# Patient Record
Sex: Male | Born: 1985 | Race: Black or African American | Hispanic: No | Marital: Married | Smoking: Never smoker
Health system: Southern US, Community
[De-identification: ages and names within clinical notes are randomized; demographics above are authoritative.]

## PROBLEM LIST (undated history)

## (undated) DIAGNOSIS — M1990 Unspecified osteoarthritis, unspecified site: Secondary | ICD-10-CM

## (undated) HISTORY — PX: HAND SURGERY: SHX662

---

## 2013-02-24 ENCOUNTER — Ambulatory Visit
Admission: RE | Admit: 2013-02-24 | Discharge: 2013-02-24 | Disposition: A | Discharge status: Home / Self Care | Payer: Worker's Compensation | Source: Ambulatory Visit | Attending: Family Medicine | Admitting: Family Medicine

## 2013-02-24 ENCOUNTER — Other Ambulatory Visit: Payer: Self-pay | Admitting: Family Medicine

## 2013-02-24 DIAGNOSIS — S39012A Strain of muscle, fascia and tendon of lower back, initial encounter: Secondary | ICD-10-CM

## 2018-07-14 ENCOUNTER — Other Ambulatory Visit: Payer: Self-pay | Admitting: Orthopedic Surgery

## 2018-07-14 DIAGNOSIS — L905 Scar conditions and fibrosis of skin: Secondary | ICD-10-CM

## 2018-07-14 DIAGNOSIS — L84 Corns and callosities: Secondary | ICD-10-CM

## 2018-07-25 ENCOUNTER — Ambulatory Visit
Admission: RE | Admit: 2018-07-25 | Discharge: 2018-07-25 | Disposition: A | Discharge status: Home / Self Care | Payer: Self-pay | Source: Ambulatory Visit | Attending: Orthopedic Surgery | Admitting: Orthopedic Surgery

## 2018-07-25 DIAGNOSIS — L84 Corns and callosities: Secondary | ICD-10-CM

## 2018-07-25 DIAGNOSIS — L905 Scar conditions and fibrosis of skin: Secondary | ICD-10-CM

## 2018-08-08 ENCOUNTER — Encounter (HOSPITAL_BASED_OUTPATIENT_CLINIC_OR_DEPARTMENT_OTHER): Payer: Self-pay | Admitting: *Deleted

## 2018-08-08 ENCOUNTER — Other Ambulatory Visit: Payer: Self-pay

## 2018-08-08 ENCOUNTER — Other Ambulatory Visit: Payer: Self-pay | Admitting: Orthopedic Surgery

## 2018-08-15 ENCOUNTER — Other Ambulatory Visit: Payer: Self-pay

## 2018-08-15 ENCOUNTER — Encounter (HOSPITAL_BASED_OUTPATIENT_CLINIC_OR_DEPARTMENT_OTHER)
Admission: RE | Disposition: A | Discharge status: Home / Self Care | Payer: Self-pay | Source: Home / Self Care | Attending: Orthopedic Surgery

## 2018-08-15 ENCOUNTER — Ambulatory Visit (HOSPITAL_BASED_OUTPATIENT_CLINIC_OR_DEPARTMENT_OTHER): Payer: BLUE CROSS/BLUE SHIELD | Admitting: Anesthesiology

## 2018-08-15 ENCOUNTER — Encounter (HOSPITAL_BASED_OUTPATIENT_CLINIC_OR_DEPARTMENT_OTHER): Payer: Self-pay | Admitting: *Deleted

## 2018-08-15 ENCOUNTER — Ambulatory Visit (HOSPITAL_BASED_OUTPATIENT_CLINIC_OR_DEPARTMENT_OTHER)
Admission: RE | Admit: 2018-08-15 | Discharge: 2018-08-15 | Disposition: A | Discharge status: Home / Self Care | Payer: BLUE CROSS/BLUE SHIELD | Attending: Orthopedic Surgery | Admitting: Orthopedic Surgery

## 2018-08-15 DIAGNOSIS — Z880 Allergy status to penicillin: Secondary | ICD-10-CM | POA: Insufficient documentation

## 2018-08-15 DIAGNOSIS — M720 Palmar fascial fibromatosis [Dupuytren]: Secondary | ICD-10-CM | POA: Insufficient documentation

## 2018-08-15 DIAGNOSIS — L84 Corns and callosities: Secondary | ICD-10-CM | POA: Diagnosis not present

## 2018-08-15 DIAGNOSIS — R2231 Localized swelling, mass and lump, right upper limb: Secondary | ICD-10-CM | POA: Diagnosis present

## 2018-08-15 HISTORY — DX: Unspecified osteoarthritis, unspecified site: M19.90

## 2018-08-15 HISTORY — PX: LESION REMOVAL: SHX5196

## 2018-08-15 SURGERY — WIDE EXCISION, LESION, UPPER EXTREMITY
Anesthesia: Monitor Anesthesia Care | Site: Hand | Laterality: Right

## 2018-08-15 MED ORDER — SCOPOLAMINE 1 MG/3DAYS TD PT72: 1.0000 | MEDICATED_PATCH | Freq: Once | TRANSDERMAL | Status: DC | PRN

## 2018-08-15 MED ORDER — CLINDAMYCIN PHOSPHATE 900 MG/50ML IV SOLN
INTRAVENOUS | Status: AC
  Filled 2018-08-15: qty 50

## 2018-08-15 MED ORDER — CLINDAMYCIN PHOSPHATE 900 MG/50ML IV SOLN
900.0000 mg | INTRAVENOUS | Status: AC
  Administered 2018-08-15: 900 mg via INTRAVENOUS

## 2018-08-15 MED ORDER — CHLORHEXIDINE GLUCONATE 4 % EX LIQD: 60.0000 mL | Freq: Once | CUTANEOUS | Status: DC

## 2018-08-15 MED ORDER — BUPIVACAINE HCL (PF) 0.25 % IJ SOLN
INTRAMUSCULAR | Status: DC | PRN
  Administered 2018-08-15: 6 mL

## 2018-08-15 MED ORDER — MIDAZOLAM HCL 2 MG/2ML IJ SOLN
1.0000 mg | INTRAMUSCULAR | Status: DC | PRN
  Administered 2018-08-15: 1 mg via INTRAVENOUS

## 2018-08-15 MED ORDER — MIDAZOLAM HCL 2 MG/2ML IJ SOLN
INTRAMUSCULAR | Status: AC
  Filled 2018-08-15: qty 2

## 2018-08-15 MED ORDER — TRAMADOL HCL 50 MG PO TABS: 50.0000 mg | ORAL_TABLET | Freq: Four times a day (QID) | ORAL | 0 refills | Status: AC | PRN

## 2018-08-15 MED ORDER — PROMETHAZINE HCL 25 MG/ML IJ SOLN: 6.2500 mg | INTRAMUSCULAR | Status: DC | PRN

## 2018-08-15 MED ORDER — ONDANSETRON HCL 4 MG/2ML IJ SOLN
INTRAMUSCULAR | Status: DC | PRN
  Administered 2018-08-15: 4 mg via INTRAVENOUS

## 2018-08-15 MED ORDER — LIDOCAINE HCL (CARDIAC) PF 100 MG/5ML IV SOSY
PREFILLED_SYRINGE | INTRAVENOUS | Status: DC | PRN
  Administered 2018-08-15: 50 mg via INTRAVENOUS

## 2018-08-15 MED ORDER — LACTATED RINGERS IV SOLN
INTRAVENOUS | Status: DC
  Administered 2018-08-15: 12:00:00 via INTRAVENOUS

## 2018-08-15 MED ORDER — FENTANYL CITRATE (PF) 100 MCG/2ML IJ SOLN: 25.0000 ug | INTRAMUSCULAR | Status: DC | PRN

## 2018-08-15 MED ORDER — PROPOFOL 500 MG/50ML IV EMUL
INTRAVENOUS | Status: DC | PRN
  Administered 2018-08-15: 75 ug/kg/min via INTRAVENOUS

## 2018-08-15 MED ORDER — FENTANYL CITRATE (PF) 100 MCG/2ML IJ SOLN
INTRAMUSCULAR | Status: AC
  Filled 2018-08-15: qty 2

## 2018-08-15 MED ORDER — LIDOCAINE HCL (PF) 0.5 % IJ SOLN
INTRAMUSCULAR | Status: DC | PRN
  Administered 2018-08-15: 35 mL via INTRAVENOUS

## 2018-08-15 MED ORDER — FENTANYL CITRATE (PF) 100 MCG/2ML IJ SOLN
50.0000 ug | INTRAMUSCULAR | Status: DC | PRN
  Administered 2018-08-15: 50 ug via INTRAVENOUS

## 2018-08-15 SURGICAL SUPPLY — 42 items
BANDAGE COBAN STERILE 2 (GAUZE/BANDAGES/DRESSINGS) IMPLANT
BLADE SURG 15 STRL LF DISP TIS (BLADE) ×1 IMPLANT
BLADE SURG 15 STRL SS (BLADE) ×2
BNDG COHESIVE 1X5 TAN STRL LF (GAUZE/BANDAGES/DRESSINGS) IMPLANT
BNDG COHESIVE 3X5 TAN STRL LF (GAUZE/BANDAGES/DRESSINGS) IMPLANT
BNDG ESMARK 4X9 LF (GAUZE/BANDAGES/DRESSINGS) IMPLANT
BNDG GAUZE ELAST 4 BULKY (GAUZE/BANDAGES/DRESSINGS) IMPLANT
CHLORAPREP W/TINT 26ML (MISCELLANEOUS) ×3 IMPLANT
CORD BIPOLAR FORCEPS 12FT (ELECTRODE) ×3 IMPLANT
COVER BACK TABLE 60X90IN (DRAPES) ×3 IMPLANT
COVER MAYO STAND STRL (DRAPES) ×3 IMPLANT
COVER WAND RF STERILE (DRAPES) IMPLANT
CUFF TOURNIQUET SINGLE 18IN (TOURNIQUET CUFF) IMPLANT
DECANTER SPIKE VIAL GLASS SM (MISCELLANEOUS) IMPLANT
DRAIN PENROSE 1/2X12 LTX STRL (WOUND CARE) IMPLANT
DRAPE EXTREMITY T 121X128X90 (DISPOSABLE) ×3 IMPLANT
DRAPE SURG 17X23 STRL (DRAPES) ×3 IMPLANT
GAUZE SPONGE 4X4 12PLY STRL (GAUZE/BANDAGES/DRESSINGS) ×3 IMPLANT
GAUZE XEROFORM 1X8 LF (GAUZE/BANDAGES/DRESSINGS) ×3 IMPLANT
GLOVE BIOGEL PI IND STRL 8.5 (GLOVE) ×1 IMPLANT
GLOVE BIOGEL PI INDICATOR 8.5 (GLOVE) ×2
GLOVE SURG ORTHO 8.0 STRL STRW (GLOVE) ×3 IMPLANT
GLOVE SURG SYN 8.0 (GLOVE) ×3 IMPLANT
GOWN STRL REUS W/ TWL LRG LVL3 (GOWN DISPOSABLE) ×1 IMPLANT
GOWN STRL REUS W/TWL LRG LVL3 (GOWN DISPOSABLE) ×2
GOWN STRL REUS W/TWL XL LVL3 (GOWN DISPOSABLE) ×3 IMPLANT
NDL SAFETY ECLIPSE 18X1.5 (NEEDLE) IMPLANT
NEEDLE HYPO 18GX1.5 SHARP (NEEDLE)
NEEDLE PRECISIONGLIDE 27X1.5 (NEEDLE) ×3 IMPLANT
NS IRRIG 1000ML POUR BTL (IV SOLUTION) ×3 IMPLANT
PACK BASIN DAY SURGERY FS (CUSTOM PROCEDURE TRAY) ×3 IMPLANT
PAD CAST 3X4 CTTN HI CHSV (CAST SUPPLIES) IMPLANT
PADDING CAST COTTON 3X4 STRL (CAST SUPPLIES)
SPLINT PLASTER CAST XFAST 3X15 (CAST SUPPLIES) IMPLANT
SPLINT PLASTER XTRA FASTSET 3X (CAST SUPPLIES)
STOCKINETTE 4X48 STRL (DRAPES) ×3 IMPLANT
SUT ETHILON 4 0 PS 2 18 (SUTURE) ×3 IMPLANT
SUT VIC AB 4-0 P2 18 (SUTURE) IMPLANT
SYR BULB 3OZ (MISCELLANEOUS) ×3 IMPLANT
SYR CONTROL 10ML LL (SYRINGE) ×3 IMPLANT
TOWEL GREEN STERILE FF (TOWEL DISPOSABLE) ×3 IMPLANT
UNDERPAD 30X30 (UNDERPADS AND DIAPERS) ×3 IMPLANT

## 2018-08-15 NOTE — Anesthesia Preprocedure Evaluation (Signed)
Anesthesia Evaluation  Patient identified by MRN, date of birth, ID band Patient awake    Reviewed: Allergy & Precautions, NPO status , Patient's Chart, lab work & pertinent test results  History of Anesthesia Complications Negative for: history of anesthetic complications  Airway Mallampati: II  TM Distance: >3 FB Neck ROM: Full    Dental  (+) Teeth Intact, Dental Advisory Given   Pulmonary neg pulmonary ROS,    Pulmonary exam normal breath sounds clear to auscultation       Cardiovascular negative cardio ROS Normal cardiovascular exam Rhythm:Regular Rate:Normal     Neuro/Psych negative neurological ROS  negative psych ROS   GI/Hepatic negative GI ROS, Neg liver ROS,   Endo/Other  negative endocrine ROS  Renal/GU negative Renal ROS     Musculoskeletal  (+) Arthritis ,   Abdominal   Peds  Hematology negative hematology ROS (+)   Anesthesia Other Findings Day of surgery medications reviewed with the patient.  Reproductive/Obstetrics                             Anesthesia Physical Anesthesia Plan  ASA: II  Anesthesia Plan: MAC and Bier Block and Bier Block-LIDOCAINE ONLY   Post-op Pain Management:    Induction: Intravenous  PONV Risk Score and Plan: 1 and Propofol infusion and Treatment may vary due to age or medical condition  Airway Management Planned: Nasal Cannula and Natural Airway  Additional Equipment:   Intra-op Plan:   Post-operative Plan:   Informed Consent: I have reviewed the patients History and Physical, chart, labs and discussed the procedure including the risks, benefits and alternatives for the proposed anesthesia with the patient or authorized representative who has indicated his/her understanding and acceptance.     Dental advisory given  Plan Discussed with: CRNA  Anesthesia Plan Comments:         Anesthesia Quick Evaluation

## 2018-08-15 NOTE — Anesthesia Postprocedure Evaluation (Signed)
Anesthesia Post Note  Patient: George Diaz  Procedure(s) Performed: LESION REMOVAL RIGHT MIDDLE FINGER (Right Hand)     Patient location during evaluation: PACU Anesthesia Type: MAC and Bier Block Level of consciousness: awake and alert Pain management: pain level controlled Vital Signs Assessment: post-procedure vital signs reviewed and stable Respiratory status: spontaneous breathing, nonlabored ventilation and respiratory function stable Cardiovascular status: stable and blood pressure returned to baseline Postop Assessment: no apparent nausea or vomiting Anesthetic complications: no    Last Vitals:  Vitals:   08/15/18 1425 08/15/18 1430  BP:    Pulse: 64 65  Resp:  20  Temp:    SpO2: 100% 98%    Last Pain:  Vitals:   08/15/18 1430  TempSrc:   PainSc: 0-No pain                 Catalina Gravel

## 2018-08-15 NOTE — H&P (Signed)
  George Diaz is an 33 y.o. male.   Chief Complaint: mass right middle finger HPI: George Diaz is a 33 year old right-hand-dominant male referred from dermatology for consultation regarding a area of opening on the dorsal aspect of his right middle finger. He states when he was 19 he stuck with a piece of metal while at work. He did not seek medical treatment at the time. It has healed over and formed a callus which became moderately swollen over the past week. He was seen at urgent care where was opened. He was placed on Bactrim. He states that the swelling has significantly diminished his pain is significantly diminished he was referred to dermatology who has immediately referred him. No cultures were taken. He has been on Bactrim. He states that the area of concern has not changed. He has moderate separation of his epidermis from the underlying dermis. Has not been debrided.He was referred for ultrasound to see if there was an foreign material still present was in the recurrent callus infection is for him. His ultrasound reveals a soft tissue poorly defined mass measuring 21 x 15 x 4 mm separated from the extensor tendon. There is fluid collection and debris at the base of it.     Past Medical History:  Diagnosis Date  . Arthritis     Past Surgical History:  Procedure Laterality Date  . HAND SURGERY      History reviewed. No pertinent family history. Social History:  reports that he has never smoked. He has never used smokeless tobacco. He reports current alcohol use. He reports that he does not use drugs.  Allergies:  Allergies  Allergen Reactions  . Amoxicillin Other (See Comments)    Headache   ? Angioedema listed in Care Everywhere    No medications prior to admission.    No results found for this or any previous visit (from the past 48 hour(s)).  No results found.   Pertinent items are noted in HPI.  Height 6' (1.829 m), weight 106.6 kg.  General appearance:  alert, cooperative and appears stated age Head: Normocephalic, without obvious abnormality Neck: no JVD Resp: clear to auscultation bilaterally Cardio: regular rate and rhythm, S1, S2 normal, no murmur, click, rub or gallop GI: soft, non-tender; bowel sounds normal; no masses,  no organomegaly Extremities:  mass right middle finger Pulses: 2+ and symmetric Skin: Skin color, texture, turgor normal. No rashes or lesions Neurologic: Grossly normal Incision/Wound: na  Assessment/Plan Assessment:  1. Scar  2. Callus  3. Foreign body (FB) in soft tissue    Plan: We have discussed excision of this along with the overlying skin is skin is supple enough to allow elliptical incision followed by direct primary closure. Pre-peri-and postoperative course are discussed along with him. He is aware there is no guarantee to the surgery possibility of infection recurrence injury to arteries nerves tendons complete relief. He is scheduled for excision mass right middle finger as an outpatient under regional anesthesia.   Cindee Salt 08/15/2018, 4:52 AM

## 2018-08-15 NOTE — Op Note (Signed)
NAME: George Diaz MEDICAL RECORD NO: [id number] DATE OF BIRTH: 1986/02/09 FACILITY: [facility] LOCATION: [facility] PHYSICIAN: Nicki Reaper, MD   OPERATIVE REPORT   DATE OF PROCEDURE: 08/15/18    PREOPERATIVE DIAGNOSIS:   Mass right middle finger   POSTOPERATIVE DIAGNOSIS: Same   PROCEDURE:   Excision mass right middle finger   SURGEON: Cindee Salt, M.D.   ASSISTANT: none   ANESTHESIA:  Bier block and Local   INTRAVENOUS FLUIDS:  Per anesthesia flow sheet.   ESTIMATED BLOOD LOSS:  Minimal.   COMPLICATIONS:  None.   SPECIMENS:   Mass right middle finger   TOURNIQUET TIME:    Total Tourniquet Time Documented: Forearm (Right) - 21 minutes Total: Forearm (Right) - 21 minutes    DISPOSITION:  Stable to PACU.   INDICATIONS: Patient is a 33 year old male with a long history of a mass over the dorsal aspect left middle finger felt to be a infected callus.  Ultrasound reveals a foreign body granuloma with involvement of the skin.  He has elected to undergo surgical excision of the mass along with the overlying skin.  Pre-peri-and postoperative course been discussed along with risks and complications.  He is aware that there is no guarantee to the surgery possibility of infection recurrence injury to arteries nerves tendons complete relief symptoms distally.  In preoperative patient seen extremity marked by both patient and surgeon antibiotic was given  OPERATIVE COURSE: She is brought to the operating room where form based IV regional anesthetic was carried out without difficulty.  Was prepped using ChloraPrep in the supine position with the arm free.  A three-minute dry time was allowed timeout taken to confirm patient procedure.  Elliptical incision was made over the mass carried down through subcutaneous tissue.  And was then elevated peripherally.  The entire area of skin mass beneath it was excised down to the extensor tendon.  Vascular structures were  identified protected as much as possible.  The specimen was sent to pathology measured 3-1/2 x 1-1/2 cm.  The wound was copiously irrigated with saline.  The skin was then closed with horizontal mattress vertical mattress sutures with 4-0 nylon sutures allowed complete closure.  Local infiltration quarter percent bupivacaine without epinephrine was given approximately 8 cc was used.  Sterile compressive dressing volar splint was applied to the middle ring and small finger.  Inflation of the tourniquet all fingers immediately pink.  Was taken to the recovery room for observation in satisfactory condition.  He would be discharged home to return [facility] in 1 week on Tylenol ibuprofen for pain with Ultram as a backup.   Cindee Salt, MD Electronically signed, 08/15/18

## 2018-08-15 NOTE — Transfer of Care (Signed)
Immediate Anesthesia Transfer of Care Note  Patient: George Diaz  Procedure(s) Performed: LESION REMOVAL RIGHT MIDDLE FINGER (Right Hand)  Patient Location: PACU  Anesthesia Type:Bier block  Level of Consciousness: awake, alert  and patient cooperative  Airway & Oxygen Therapy: Patient Spontanous Breathing  Post-op Assessment: Report given to RN and Post -op Vital signs reviewed and stable  Post vital signs: Reviewed and stable  Last Vitals:  Vitals Value Taken Time  BP    Temp    Pulse    Resp    SpO2      Last Pain:  Vitals:   08/15/18 1207  TempSrc: Oral  PainSc: 0-No pain      Patients Stated Pain Goal: 0 (08/15/18 1207)  Complications: No apparent anesthesia complications

## 2018-08-15 NOTE — Discharge Instructions (Addendum)

## 2018-08-15 NOTE — Brief Op Note (Signed)
08/15/2018  2:05 PM  PATIENT:  George Diaz  33 y.o. male  PRE-OPERATIVE DIAGNOSIS:  MASS RIGHT MIDDLE FINGER  POST-OPERATIVE DIAGNOSIS:  MASS RIGHT MIDDLE FINGER  PROCEDURE:  Procedure(s): LESION REMOVAL RIGHT MIDDLE FINGER (Right)  SURGEON:  Surgeon(s) and Role:    Cindee Salt, MD - Primary  PHYSICIAN ASSISTANT:   ASSISTANTS: none   ANESTHESIA:   local, regional and IV sedation  EBL: 33ml  BLOOD ADMINISTERED:none  DRAINS: none   LOCAL MEDICATIONS USED:  BUPIVICAINE   SPECIMEN:  Excision  DISPOSITION OF SPECIMEN:  PATHOLOGY  COUNTS:  YES  TOURNIQUET:   Total Tourniquet Time Documented: Forearm (Right) - 21 minutes Total: Forearm (Right) - 21 minutes   DICTATION: .Reubin Milan Dictation  PLAN OF CARE: Discharge to home after PACU  PATIENT DISPOSITION:  PACU - hemodynamically stable.

## 2018-08-18 ENCOUNTER — Encounter (HOSPITAL_BASED_OUTPATIENT_CLINIC_OR_DEPARTMENT_OTHER): Payer: Self-pay | Admitting: Orthopedic Surgery

## 2019-09-16 ENCOUNTER — Other Ambulatory Visit: Payer: Self-pay | Admitting: Family Medicine

## 2019-09-16 DIAGNOSIS — M5416 Radiculopathy, lumbar region: Secondary | ICD-10-CM

## 2019-09-16 DIAGNOSIS — M545 Low back pain, unspecified: Secondary | ICD-10-CM

## 2019-09-16 DIAGNOSIS — R29898 Other symptoms and signs involving the musculoskeletal system: Secondary | ICD-10-CM

## 2019-09-26 ENCOUNTER — Other Ambulatory Visit: Payer: Self-pay

## 2019-09-26 ENCOUNTER — Ambulatory Visit
Admission: RE | Admit: 2019-09-26 | Discharge: 2019-09-26 | Disposition: A | Discharge status: Home / Self Care | Payer: BLUE CROSS/BLUE SHIELD | Source: Ambulatory Visit | Attending: Family Medicine | Admitting: Family Medicine

## 2019-09-26 DIAGNOSIS — M545 Low back pain, unspecified: Secondary | ICD-10-CM

## 2019-09-26 DIAGNOSIS — M5416 Radiculopathy, lumbar region: Secondary | ICD-10-CM

## 2019-09-26 DIAGNOSIS — R29898 Other symptoms and signs involving the musculoskeletal system: Secondary | ICD-10-CM

## 2020-03-23 ENCOUNTER — Other Ambulatory Visit: Payer: Self-pay

## 2020-03-23 ENCOUNTER — Other Ambulatory Visit: Payer: BC Managed Care – PPO

## 2020-03-23 DIAGNOSIS — Z20822 Contact with and (suspected) exposure to covid-19: Secondary | ICD-10-CM

## 2020-03-25 LAB — NOVEL CORONAVIRUS, NAA: SARS-CoV-2, NAA: NOT DETECTED

## 2021-09-20 IMAGING — MR MR LUMBAR SPINE W/O CM
5 series · 46 of 48 positions shown · non-contrast
Comparison: Radiography 02/24/2013

CLINICAL DATA: Low back pain radiating to the right buttock over
the last 2 months.

EXAM:
MRI LUMBAR SPINE WITHOUT CONTRAST
TECHNIQUE: Multiplanar, multisequence MR imaging of the lumbar spine was
performed. No intravenous contrast was administered.

[Series 3: tirm sag · sagittal · 4.0mm · 0.55mm/px · 6 of 13 slices shown]
[im 1/13]
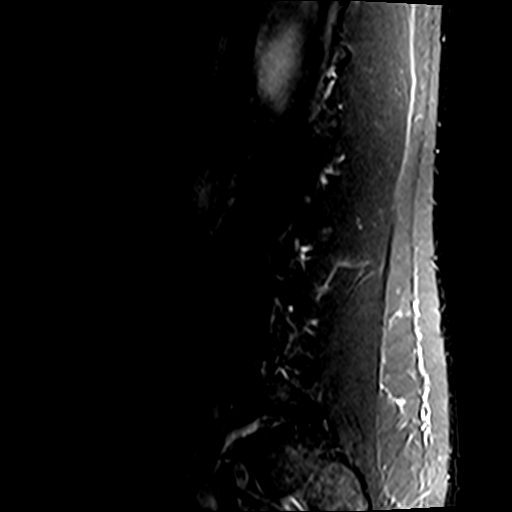
[im 3/13]
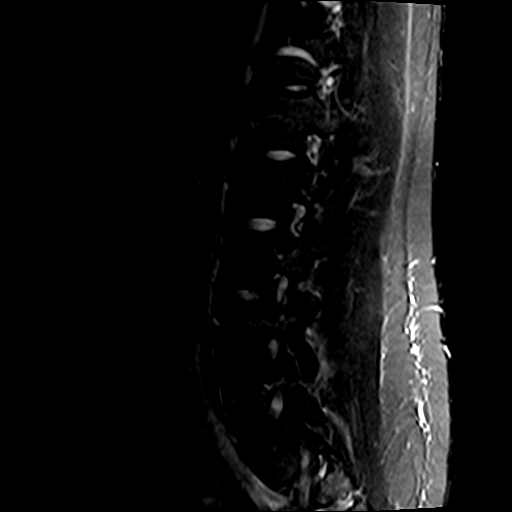
[im 5/13]
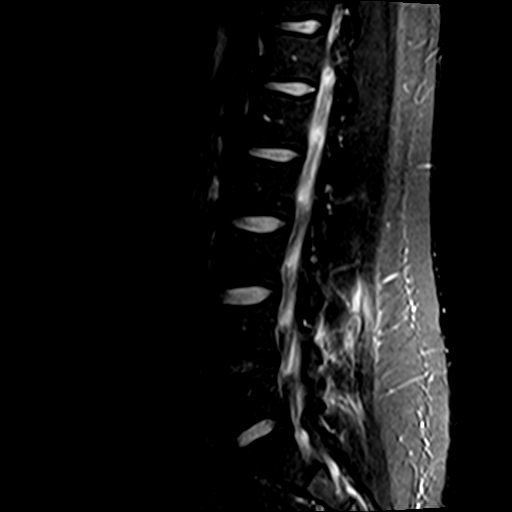
[im 8/13]
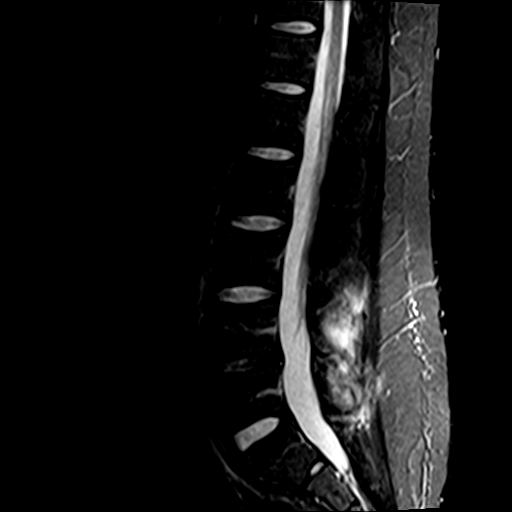
[im 10/13]
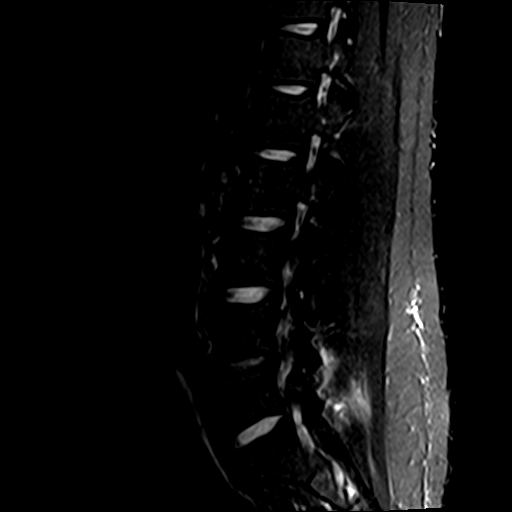
[im 13/13]
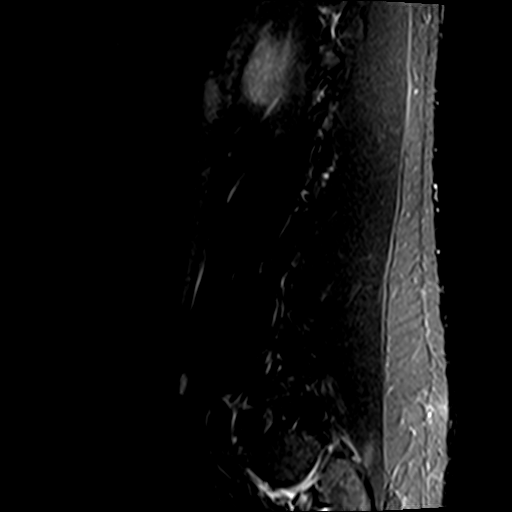

[Series 4: T2 · sagittal · 4.0mm · 0.88mm/px · 5 of 13 slices shown (1 of 2)]
[im 1/13]
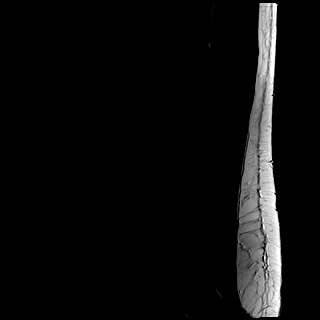
[im 4/13]
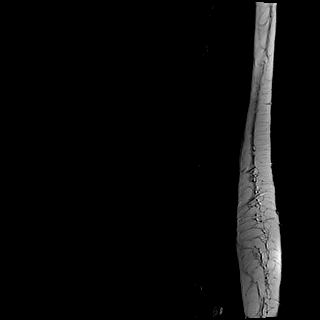
[im 7/13]
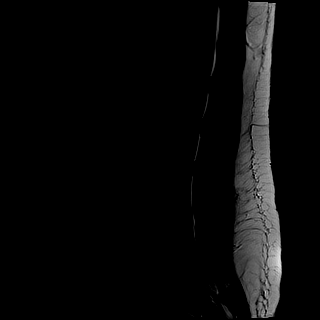
[im 10/13]
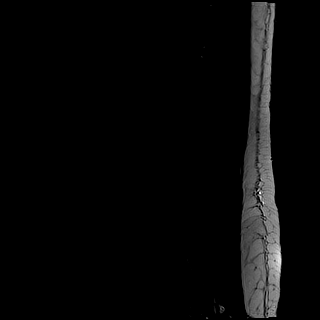
[im 13/13]
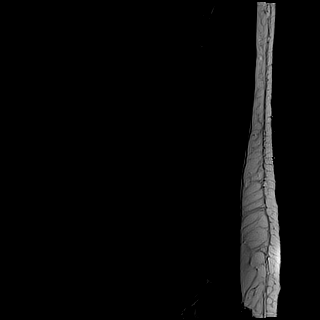

[Series 5: T1 · sagittal · 4.0mm · 0.88mm/px · 5 of 13 slices shown (1 of 2)]
[im 1/13]
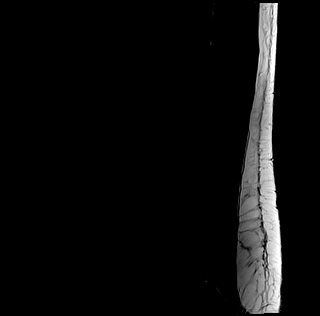
[im 4/13]
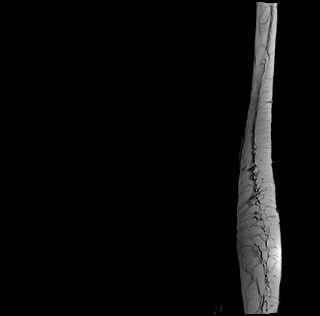
[im 7/13]
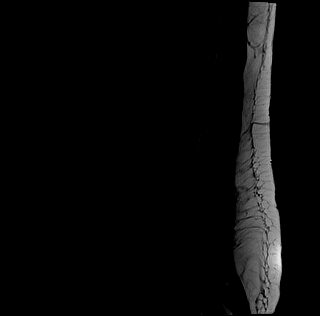
[im 10/13]
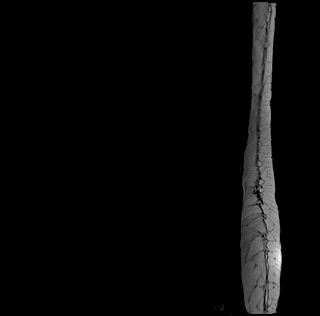
[im 13/13]
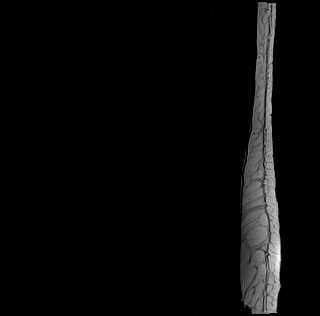

[Series 6: T1 · axial · 4.0mm · 0.78mm/px · z∈[-62,+168]mm · 14 of 42 slices shown (2 of 2)]
[im 1/42]
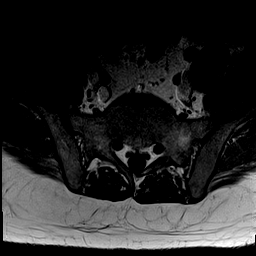
[im 3/42]
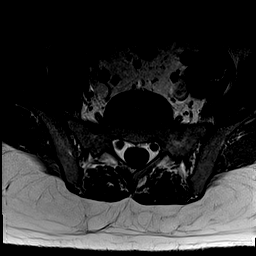
[im 6/42]
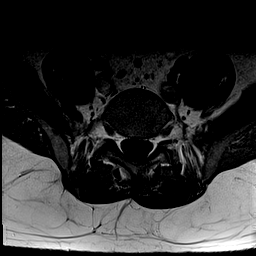
[im 9/42]
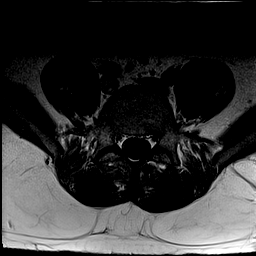
[im 11/42]
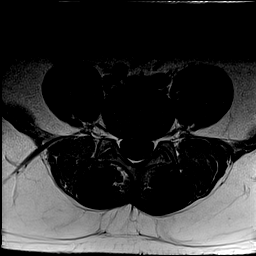
[im 14/42]
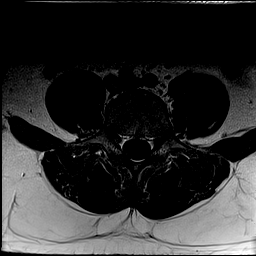
[im 17/42]
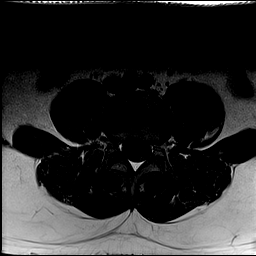
[im 20/42]
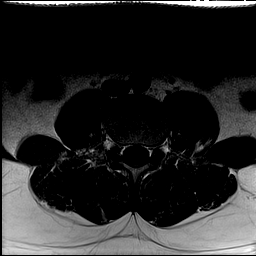
[im 22/42]
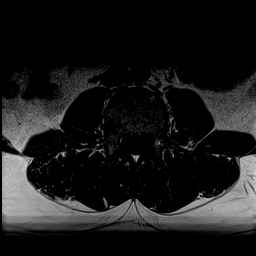
[im 25/42]
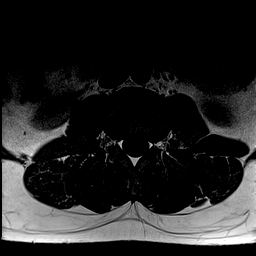
[im 28/42]
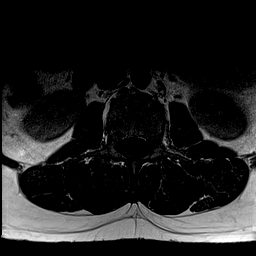
[im 31/42]
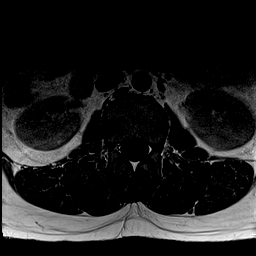
[im 36/42]
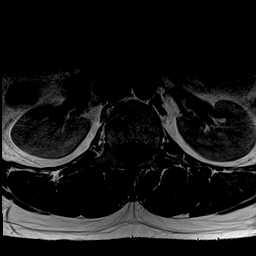
[im 42/42]
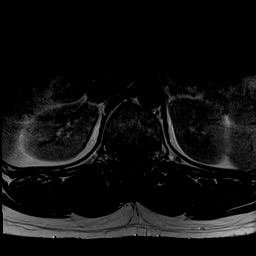

[Series 7: T2 · axial · 4.0mm · 0.78mm/px · z∈[-62,+168]mm · 16 of 42 slices shown (2 of 2)]
[im 1/42]
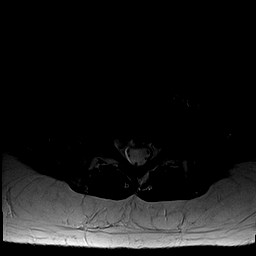
[im 3/42]
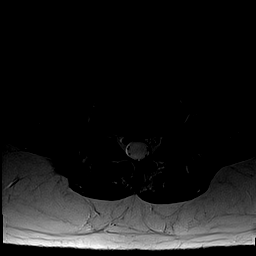
[im 6/42]
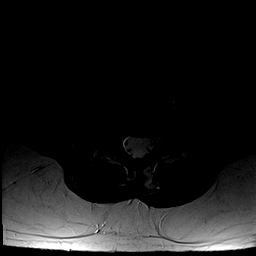
[im 9/42]
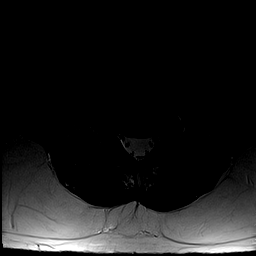
[im 11/42]
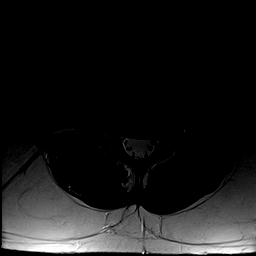
[im 14/42]
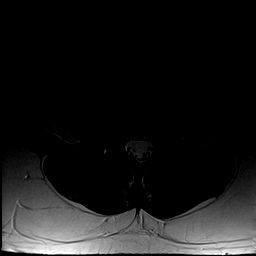
[im 17/42]
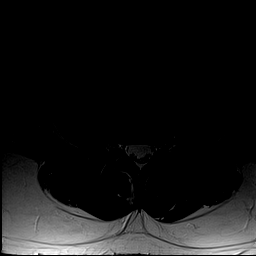
[im 20/42]
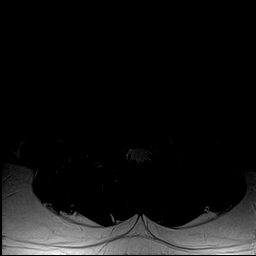
[im 22/42]
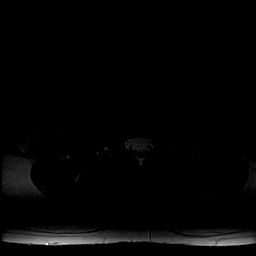
[im 25/42]
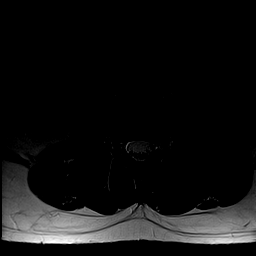
[im 28/42]
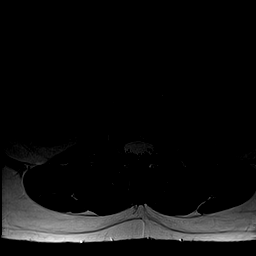
[im 31/42]
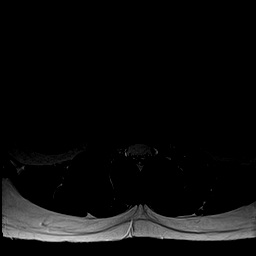
[im 33/42]
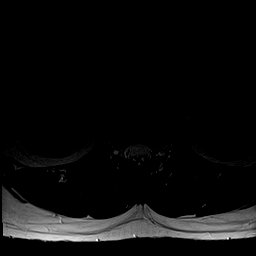
[im 36/42]
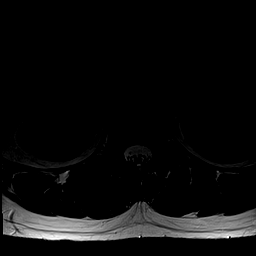
[im 39/42]
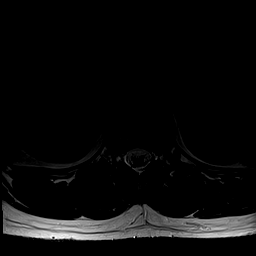
[im 42/42]
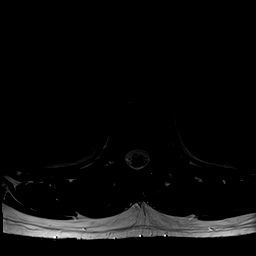

[46 of 48 positions shown; findings below may reference images not displayed]

FINDINGS: Segmentation:  5 lumbar type vertebral bodies.

Alignment:  Normal

Vertebrae:  Normal

Conus medullaris and cauda equina: Conus extends to the L1 level.
Conus and cauda equina appear normal.

Paraspinal and other soft tissues: Normal

Disc levels:

No abnormality at L3-4 or above.

At L4-5, the disc shows desiccation with mild annular bulging. This
indents the thecal sac slightly but does not cause visible neural
compression. Neural irritation could occur.

L5-S1 is normal.

Patient does have some facet edema at the L4-5 level, mild in
degree, but possibly symptomatic.
IMPRESSION: Single level pathology at L4-5. Disc degeneration with desiccation
and annular bulging. No apparent compressive stenosis. Mild facet
osteoarthritis with edema right more than left. Could the patient's
pain relate to facet syndrome?

## 2021-10-10 DIAGNOSIS — Z20818 Contact with and (suspected) exposure to other bacterial communicable diseases: Secondary | ICD-10-CM | POA: Diagnosis not present

## 2021-10-10 DIAGNOSIS — J029 Acute pharyngitis, unspecified: Secondary | ICD-10-CM | POA: Diagnosis not present

## 2021-11-02 DIAGNOSIS — R079 Chest pain, unspecified: Secondary | ICD-10-CM | POA: Diagnosis not present

## 2021-11-15 DIAGNOSIS — J039 Acute tonsillitis, unspecified: Secondary | ICD-10-CM | POA: Diagnosis not present

## 2022-05-31 DIAGNOSIS — R109 Unspecified abdominal pain: Secondary | ICD-10-CM | POA: Diagnosis not present

## 2022-07-25 DIAGNOSIS — Z13220 Encounter for screening for lipoid disorders: Secondary | ICD-10-CM | POA: Diagnosis not present

## 2022-07-25 DIAGNOSIS — Z0000 Encounter for general adult medical examination without abnormal findings: Secondary | ICD-10-CM | POA: Diagnosis not present

## 2023-01-26 DIAGNOSIS — J029 Acute pharyngitis, unspecified: Secondary | ICD-10-CM | POA: Diagnosis not present

## 2023-01-26 DIAGNOSIS — R0982 Postnasal drip: Secondary | ICD-10-CM | POA: Diagnosis not present

## 2023-04-26 DIAGNOSIS — L02612 Cutaneous abscess of left foot: Secondary | ICD-10-CM | POA: Diagnosis not present

## 2023-04-26 DIAGNOSIS — M79675 Pain in left toe(s): Secondary | ICD-10-CM | POA: Diagnosis not present

## 2023-06-17 DIAGNOSIS — R509 Fever, unspecified: Secondary | ICD-10-CM | POA: Diagnosis not present

## 2023-06-17 DIAGNOSIS — R430 Anosmia: Secondary | ICD-10-CM | POA: Diagnosis not present

## 2023-06-17 DIAGNOSIS — J019 Acute sinusitis, unspecified: Secondary | ICD-10-CM | POA: Diagnosis not present

## 2023-06-17 DIAGNOSIS — R5381 Other malaise: Secondary | ICD-10-CM | POA: Diagnosis not present
